# Patient Record
Sex: Female | Born: 1945
Health system: Southern US, Community
[De-identification: ages and names within clinical notes are randomized; demographics above are authoritative.]

## PROBLEM LIST (undated history)

## (undated) DIAGNOSIS — Z923 Personal history of irradiation: Secondary | ICD-10-CM

## (undated) DIAGNOSIS — I4891 Unspecified atrial fibrillation: Secondary | ICD-10-CM

## (undated) HISTORY — PX: TUBAL LIGATION: SHX77

## (undated) HISTORY — PX: ABDOMINAL HYSTERECTOMY: SHX81

---

## 1998-07-12 DIAGNOSIS — Z923 Personal history of irradiation: Secondary | ICD-10-CM

## 1998-07-12 HISTORY — DX: Personal history of irradiation: Z92.3

## 1999-04-16 ENCOUNTER — Other Ambulatory Visit: Admission: RE | Admit: 1999-04-16 | Discharge: 1999-04-16 | Payer: Self-pay | Admitting: Obstetrics and Gynecology

## 1999-04-21 ENCOUNTER — Other Ambulatory Visit: Admission: RE | Admit: 1999-04-21 | Discharge: 1999-04-21 | Payer: Self-pay | Admitting: Obstetrics and Gynecology

## 1999-05-11 ENCOUNTER — Encounter: Payer: Self-pay | Admitting: Surgery

## 1999-05-11 ENCOUNTER — Encounter: Admission: RE | Admit: 1999-05-11 | Discharge: 1999-05-11 | Payer: Self-pay | Admitting: Surgery

## 1999-05-12 ENCOUNTER — Encounter (INDEPENDENT_AMBULATORY_CARE_PROVIDER_SITE_OTHER): Payer: Self-pay | Admitting: Specialist

## 1999-05-12 ENCOUNTER — Ambulatory Visit (HOSPITAL_BASED_OUTPATIENT_CLINIC_OR_DEPARTMENT_OTHER): Admission: RE | Admit: 1999-05-12 | Discharge: 1999-05-12 | Payer: Self-pay | Admitting: Surgery

## 1999-05-26 ENCOUNTER — Ambulatory Visit (HOSPITAL_COMMUNITY): Admission: RE | Admit: 1999-05-26 | Discharge: 1999-05-26 | Payer: Self-pay | Admitting: Surgery

## 1999-05-26 ENCOUNTER — Encounter: Payer: Self-pay | Admitting: Surgery

## 1999-06-03 ENCOUNTER — Encounter: Admission: RE | Admit: 1999-06-03 | Discharge: 1999-09-01 | Payer: Self-pay | Admitting: Radiation Oncology

## 1999-06-30 ENCOUNTER — Encounter: Payer: Self-pay | Admitting: Oncology

## 1999-06-30 ENCOUNTER — Ambulatory Visit (HOSPITAL_COMMUNITY): Admission: RE | Admit: 1999-06-30 | Discharge: 1999-06-30 | Payer: Self-pay | Admitting: Oncology

## 1999-12-21 ENCOUNTER — Encounter: Admission: RE | Admit: 1999-12-21 | Discharge: 2000-03-20 | Payer: Self-pay | Admitting: Radiation Oncology

## 1999-12-24 ENCOUNTER — Encounter: Payer: Self-pay | Admitting: Oncology

## 1999-12-24 ENCOUNTER — Ambulatory Visit (HOSPITAL_COMMUNITY): Admission: RE | Admit: 1999-12-24 | Discharge: 1999-12-24 | Payer: Self-pay | Admitting: Oncology

## 2000-05-10 ENCOUNTER — Other Ambulatory Visit: Admission: RE | Admit: 2000-05-10 | Discharge: 2000-05-10 | Payer: Self-pay | Admitting: Obstetrics and Gynecology

## 2000-07-12 HISTORY — PX: MASTECTOMY: SHX3

## 2000-07-12 HISTORY — PX: BREAST BIOPSY: SHX20

## 2000-08-15 ENCOUNTER — Other Ambulatory Visit: Admission: RE | Admit: 2000-08-15 | Discharge: 2000-08-15 | Payer: Self-pay | Admitting: Surgery

## 2000-08-19 ENCOUNTER — Encounter: Admission: RE | Admit: 2000-08-19 | Discharge: 2000-08-19 | Payer: Self-pay | Admitting: Surgery

## 2000-08-19 ENCOUNTER — Encounter: Payer: Self-pay | Admitting: Surgery

## 2001-02-23 ENCOUNTER — Encounter: Payer: Self-pay | Admitting: Oncology

## 2001-02-23 ENCOUNTER — Ambulatory Visit (HOSPITAL_COMMUNITY): Admission: RE | Admit: 2001-02-23 | Discharge: 2001-02-23 | Payer: Self-pay | Admitting: Oncology

## 2001-05-22 ENCOUNTER — Encounter: Payer: Self-pay | Admitting: Oncology

## 2001-05-22 ENCOUNTER — Encounter: Admission: RE | Admit: 2001-05-22 | Discharge: 2001-05-22 | Payer: Self-pay | Admitting: Oncology

## 2002-02-06 ENCOUNTER — Encounter: Payer: Self-pay | Admitting: Oncology

## 2002-02-06 ENCOUNTER — Ambulatory Visit (HOSPITAL_COMMUNITY): Admission: RE | Admit: 2002-02-06 | Discharge: 2002-02-06 | Payer: Self-pay | Admitting: Oncology

## 2002-05-24 ENCOUNTER — Encounter: Payer: Self-pay | Admitting: Oncology

## 2002-05-24 ENCOUNTER — Encounter: Admission: RE | Admit: 2002-05-24 | Discharge: 2002-05-24 | Payer: Self-pay | Admitting: Oncology

## 2002-08-12 IMAGING — CT CT CHEST W/ CM
1 of 2 series · 14 of 32 positions shown, 18 images · IV contrast ([ID] [id number])
Comparison: none

FINDINGS
CLINICAL DATA: HISTORY OF BREAST CANCER IN April 1999.
CT CHEST WITH INTRAVENOUS CONTRAST:
IMAGES ARE OBTAINED THROUGH THE CHEST FOLLOWING THE ADMINISTRATION OF 100 CC ON OMNIPAQUE 300%
INTRAVENOUSLY.  COMPARISON IS MADE TO PRIOR EXAM DATED 12/24/99.
NO ENLARGED HILAR OR MEDIASTINAL LYMPH NODES ARE IDENTIFIED.  A PREVIOUSLY NOTED PRECARINAL LYMPH
NODE ON PRIOR STUDY IS SMALLER THAN ON PRIOR EXAM.  NO PULMONARY MASS IS IDENTIFIED.  THERE ARE
TINY BLEBS AT THE LUNG APICES.  NO PLEURAL EFFUSION IS NOTED.  NO FOCAL CONSOLIDATION IS NOTED.
VISUALIZED PORTION OF THE LIVER IS NORMAL.
IMPRESSION
NO ACUTE ABNORMALITY OF THE CHEST.

[Series 2: — · axial · 0.68mm/px · z∈[-295,-8]mm · 14 of 49 slices shown, 18 images]
[im 4/49  mediastinal]
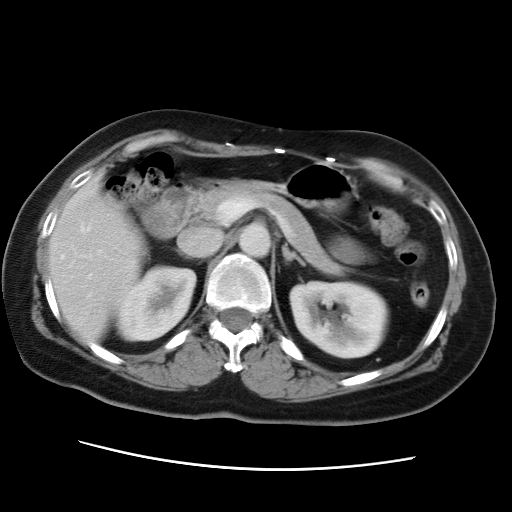
[im 4/49  lung]
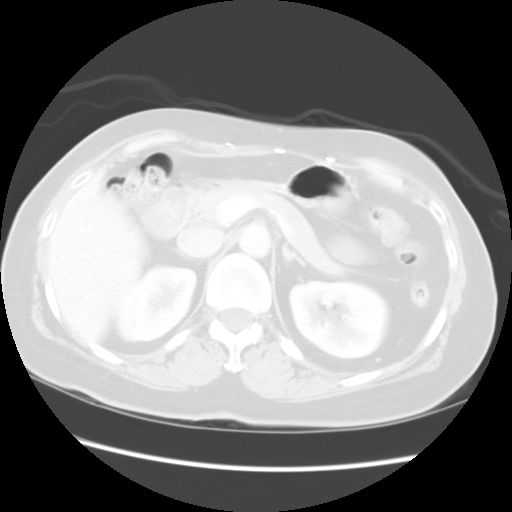
[im 8/49  lung]
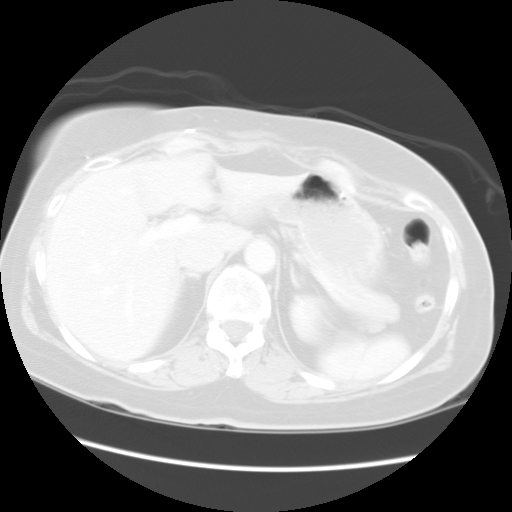
[im 12/49  lung]
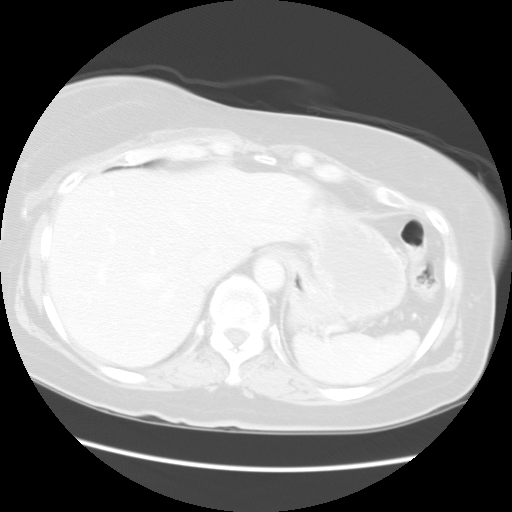
[im 15/49  lung]
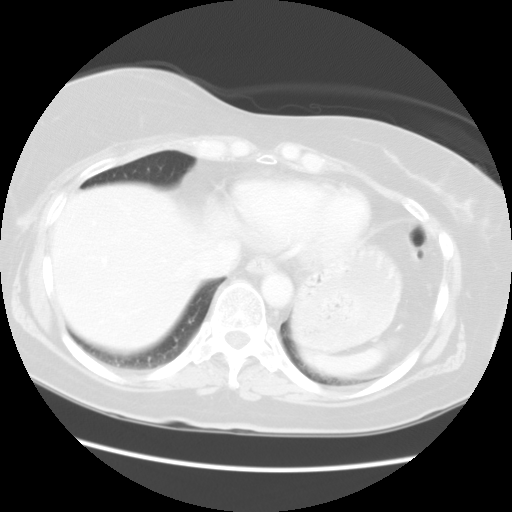
[im 19/49  mediastinal]
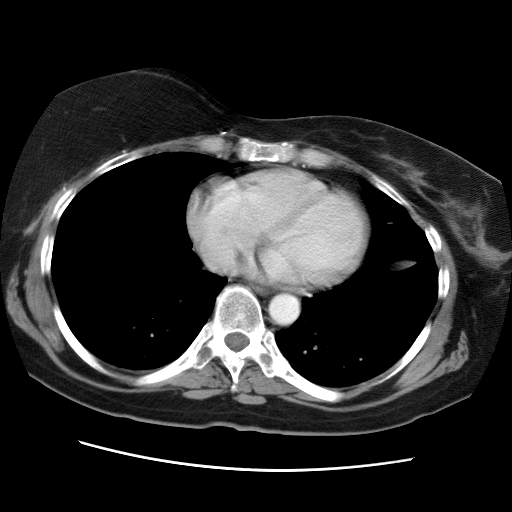
[im 19/49  lung]
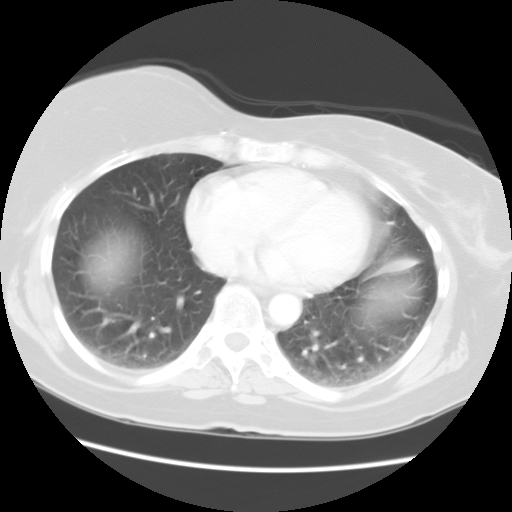
[im 23/49  lung]
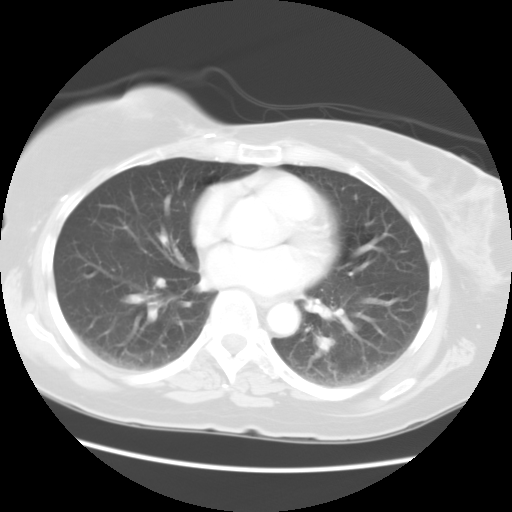
[im 24/49  lung]
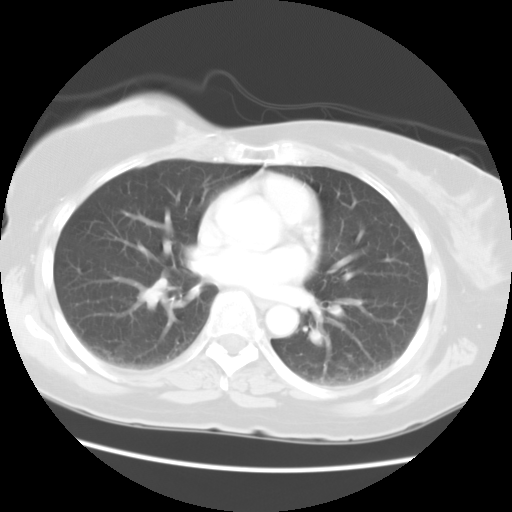
[im 25/49  lung]
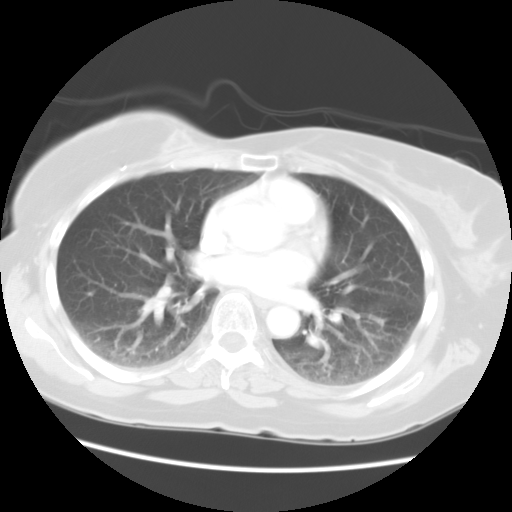
[im 26/49  mediastinal]
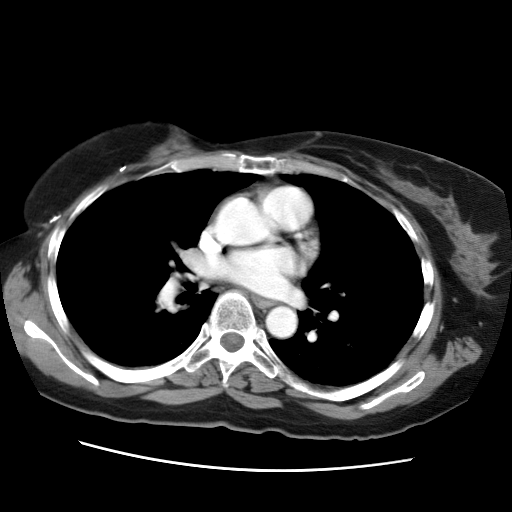
[im 26/49  lung]
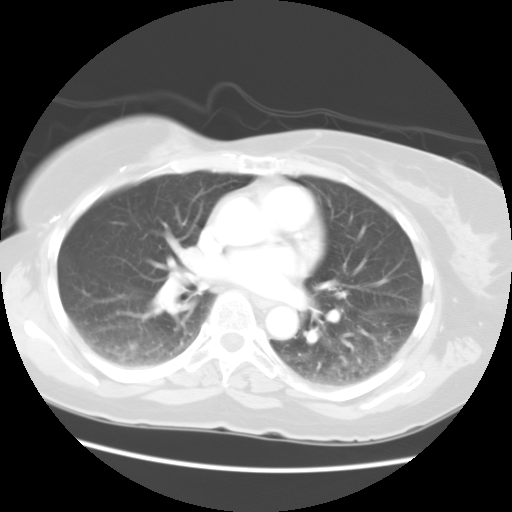
[im 30/49  lung]
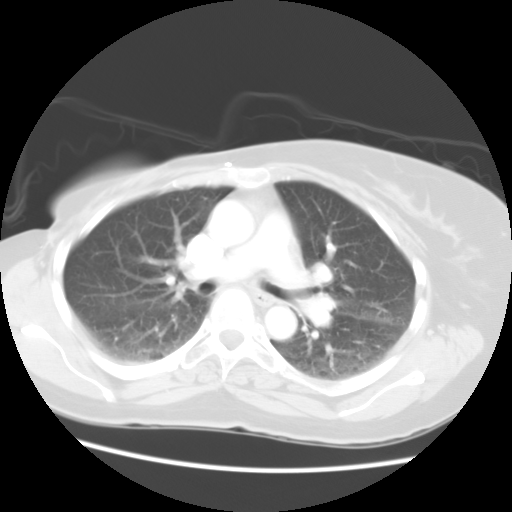
[im 34/49  lung]
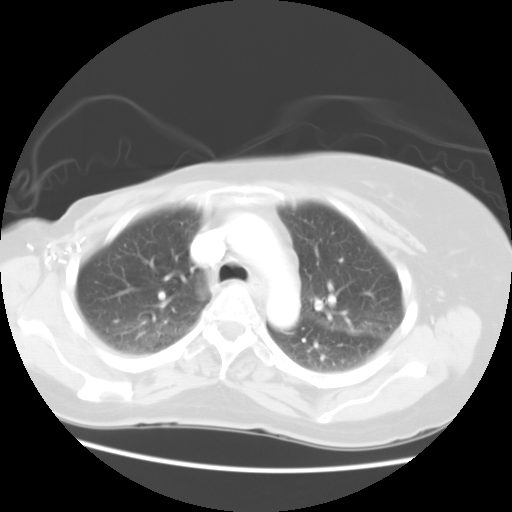
[im 37/49  lung]
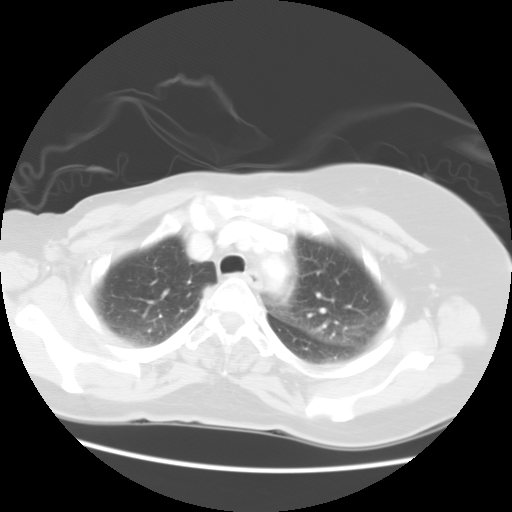
[im 41/49  mediastinal]
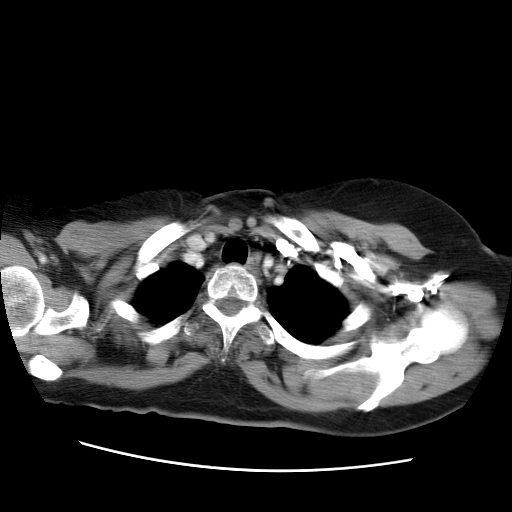
[im 41/49  lung]
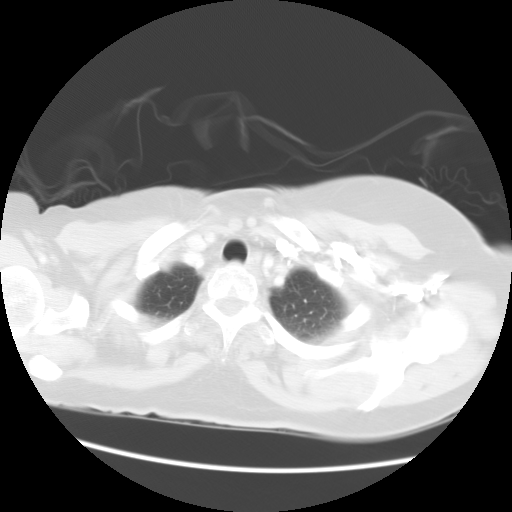
[im 45/49  lung]
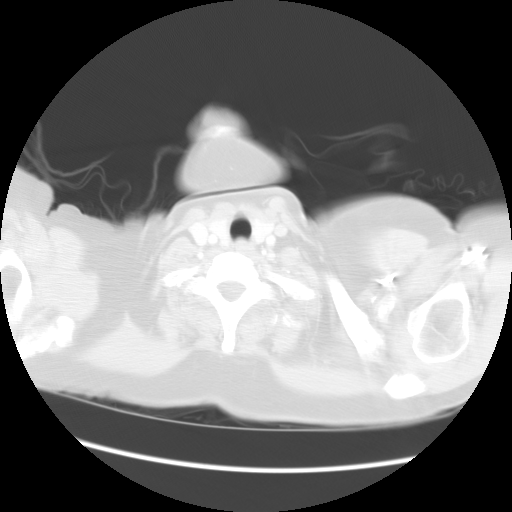

[14 of 32 positions shown; findings below may reference images not displayed]

## 2003-05-27 ENCOUNTER — Encounter: Admission: RE | Admit: 2003-05-27 | Discharge: 2003-05-27 | Payer: Self-pay | Admitting: Oncology

## 2003-10-15 ENCOUNTER — Encounter: Admission: RE | Admit: 2003-10-15 | Discharge: 2003-10-15 | Payer: Self-pay | Admitting: Oncology

## 2004-06-18 ENCOUNTER — Encounter: Admission: RE | Admit: 2004-06-18 | Discharge: 2004-06-18 | Payer: Self-pay | Admitting: Oncology

## 2005-07-13 ENCOUNTER — Encounter: Admission: RE | Admit: 2005-07-13 | Discharge: 2005-07-13 | Payer: Self-pay | Admitting: Obstetrics and Gynecology

## 2006-09-13 ENCOUNTER — Encounter: Admission: RE | Admit: 2006-09-13 | Discharge: 2006-09-13 | Payer: Self-pay | Admitting: Obstetrics and Gynecology

## 2008-08-27 ENCOUNTER — Encounter: Admission: RE | Admit: 2008-08-27 | Discharge: 2008-08-27 | Payer: Self-pay | Admitting: Internal Medicine

## 2009-09-02 ENCOUNTER — Encounter: Admission: RE | Admit: 2009-09-02 | Discharge: 2009-09-02 | Payer: Self-pay | Admitting: Internal Medicine

## 2010-08-01 ENCOUNTER — Encounter: Payer: Self-pay | Admitting: Oncology

## 2012-08-10 DIAGNOSIS — N814 Uterovaginal prolapse, unspecified: Secondary | ICD-10-CM | POA: Insufficient documentation

## 2012-11-17 ENCOUNTER — Other Ambulatory Visit: Payer: Self-pay | Admitting: Internal Medicine

## 2012-11-17 DIAGNOSIS — Z1231 Encounter for screening mammogram for malignant neoplasm of breast: Secondary | ICD-10-CM

## 2012-11-17 DIAGNOSIS — Z9011 Acquired absence of right breast and nipple: Secondary | ICD-10-CM

## 2012-12-20 ENCOUNTER — Ambulatory Visit
Admission: RE | Admit: 2012-12-20 | Discharge: 2012-12-20 | Disposition: A | Discharge status: Home / Self Care | Payer: Medicare Other | Source: Ambulatory Visit | Attending: Internal Medicine | Admitting: Internal Medicine

## 2012-12-20 DIAGNOSIS — Z1231 Encounter for screening mammogram for malignant neoplasm of breast: Secondary | ICD-10-CM

## 2012-12-20 DIAGNOSIS — Z9011 Acquired absence of right breast and nipple: Secondary | ICD-10-CM

## 2013-12-24 ENCOUNTER — Other Ambulatory Visit: Payer: Self-pay

## 2013-12-24 DIAGNOSIS — Z9011 Acquired absence of right breast and nipple: Secondary | ICD-10-CM

## 2013-12-24 DIAGNOSIS — Z1231 Encounter for screening mammogram for malignant neoplasm of breast: Secondary | ICD-10-CM

## 2014-01-03 ENCOUNTER — Encounter (INDEPENDENT_AMBULATORY_CARE_PROVIDER_SITE_OTHER): Payer: Self-pay

## 2014-01-03 ENCOUNTER — Ambulatory Visit
Admission: RE | Admit: 2014-01-03 | Discharge: 2014-01-03 | Disposition: A | Discharge status: Home / Self Care | Payer: Medicare Other | Source: Ambulatory Visit

## 2014-01-03 DIAGNOSIS — Z1231 Encounter for screening mammogram for malignant neoplasm of breast: Secondary | ICD-10-CM

## 2014-01-03 DIAGNOSIS — Z9011 Acquired absence of right breast and nipple: Secondary | ICD-10-CM

## 2014-01-10 DIAGNOSIS — N83209 Unspecified ovarian cyst, unspecified side: Secondary | ICD-10-CM | POA: Insufficient documentation

## 2014-05-07 DIAGNOSIS — N3946 Mixed incontinence: Secondary | ICD-10-CM | POA: Insufficient documentation

## 2014-05-07 DIAGNOSIS — C50919 Malignant neoplasm of unspecified site of unspecified female breast: Secondary | ICD-10-CM | POA: Insufficient documentation

## 2015-04-25 ENCOUNTER — Other Ambulatory Visit: Payer: Self-pay

## 2015-04-25 DIAGNOSIS — Z1231 Encounter for screening mammogram for malignant neoplasm of breast: Secondary | ICD-10-CM

## 2015-05-27 ENCOUNTER — Ambulatory Visit
Admission: RE | Admit: 2015-05-27 | Discharge: 2015-05-27 | Disposition: A | Discharge status: Home / Self Care | Payer: Medicare Other | Source: Ambulatory Visit

## 2015-05-27 DIAGNOSIS — Z1231 Encounter for screening mammogram for malignant neoplasm of breast: Secondary | ICD-10-CM

## 2016-07-16 DIAGNOSIS — Z01419 Encounter for gynecological examination (general) (routine) without abnormal findings: Secondary | ICD-10-CM | POA: Diagnosis not present

## 2016-08-10 ENCOUNTER — Other Ambulatory Visit: Payer: Self-pay | Admitting: Internal Medicine

## 2016-08-10 DIAGNOSIS — Z1231 Encounter for screening mammogram for malignant neoplasm of breast: Secondary | ICD-10-CM

## 2016-08-23 ENCOUNTER — Ambulatory Visit
Admission: RE | Admit: 2016-08-23 | Discharge: 2016-08-23 | Disposition: A | Discharge status: Home / Self Care | Payer: Medicare Other | Source: Ambulatory Visit | Attending: Internal Medicine | Admitting: Internal Medicine

## 2016-08-23 DIAGNOSIS — Z1231 Encounter for screening mammogram for malignant neoplasm of breast: Secondary | ICD-10-CM | POA: Diagnosis not present

## 2016-08-23 HISTORY — DX: Personal history of irradiation: Z92.3

## 2016-09-27 DIAGNOSIS — J209 Acute bronchitis, unspecified: Secondary | ICD-10-CM | POA: Diagnosis not present

## 2016-09-27 DIAGNOSIS — J309 Allergic rhinitis, unspecified: Secondary | ICD-10-CM | POA: Diagnosis not present

## 2016-09-27 DIAGNOSIS — J0100 Acute maxillary sinusitis, unspecified: Secondary | ICD-10-CM | POA: Diagnosis not present

## 2016-10-22 DIAGNOSIS — R5383 Other fatigue: Secondary | ICD-10-CM | POA: Diagnosis not present

## 2016-10-22 DIAGNOSIS — M5126 Other intervertebral disc displacement, lumbar region: Secondary | ICD-10-CM | POA: Insufficient documentation

## 2016-10-22 DIAGNOSIS — R0789 Other chest pain: Secondary | ICD-10-CM | POA: Diagnosis not present

## 2016-10-22 DIAGNOSIS — Z853 Personal history of malignant neoplasm of breast: Secondary | ICD-10-CM | POA: Diagnosis not present

## 2016-10-22 DIAGNOSIS — K219 Gastro-esophageal reflux disease without esophagitis: Secondary | ICD-10-CM | POA: Diagnosis not present

## 2016-10-22 DIAGNOSIS — D649 Anemia, unspecified: Secondary | ICD-10-CM | POA: Insufficient documentation

## 2016-10-22 DIAGNOSIS — R0602 Shortness of breath: Secondary | ICD-10-CM | POA: Diagnosis not present

## 2016-10-22 DIAGNOSIS — R079 Chest pain, unspecified: Secondary | ICD-10-CM | POA: Diagnosis not present

## 2016-10-22 DIAGNOSIS — R5381 Other malaise: Secondary | ICD-10-CM | POA: Diagnosis not present

## 2016-10-22 DIAGNOSIS — E782 Mixed hyperlipidemia: Secondary | ICD-10-CM | POA: Diagnosis not present

## 2016-10-22 DIAGNOSIS — Z79899 Other long term (current) drug therapy: Secondary | ICD-10-CM | POA: Diagnosis not present

## 2016-10-24 DIAGNOSIS — R0789 Other chest pain: Secondary | ICD-10-CM | POA: Insufficient documentation

## 2016-10-24 DIAGNOSIS — K219 Gastro-esophageal reflux disease without esophagitis: Secondary | ICD-10-CM | POA: Insufficient documentation

## 2016-10-25 DIAGNOSIS — D519 Vitamin B12 deficiency anemia, unspecified: Secondary | ICD-10-CM | POA: Diagnosis not present

## 2016-10-25 DIAGNOSIS — R5381 Other malaise: Secondary | ICD-10-CM | POA: Diagnosis not present

## 2016-10-25 DIAGNOSIS — E782 Mixed hyperlipidemia: Secondary | ICD-10-CM | POA: Diagnosis not present

## 2016-10-25 DIAGNOSIS — R5383 Other fatigue: Secondary | ICD-10-CM | POA: Diagnosis not present

## 2017-07-21 ENCOUNTER — Other Ambulatory Visit: Payer: Self-pay | Admitting: Internal Medicine

## 2017-07-21 DIAGNOSIS — Z1231 Encounter for screening mammogram for malignant neoplasm of breast: Secondary | ICD-10-CM

## 2017-08-24 ENCOUNTER — Ambulatory Visit
Admission: RE | Admit: 2017-08-24 | Discharge: 2017-08-24 | Disposition: A | Discharge status: Home / Self Care | Payer: Medicare Other | Source: Ambulatory Visit | Attending: Internal Medicine | Admitting: Internal Medicine

## 2017-08-24 DIAGNOSIS — Z1231 Encounter for screening mammogram for malignant neoplasm of breast: Secondary | ICD-10-CM

## 2017-09-22 DIAGNOSIS — H2513 Age-related nuclear cataract, bilateral: Secondary | ICD-10-CM | POA: Diagnosis not present

## 2018-04-25 DIAGNOSIS — R5383 Other fatigue: Secondary | ICD-10-CM | POA: Diagnosis not present

## 2018-04-25 DIAGNOSIS — M5126 Other intervertebral disc displacement, lumbar region: Secondary | ICD-10-CM | POA: Diagnosis not present

## 2018-04-25 DIAGNOSIS — R0789 Other chest pain: Secondary | ICD-10-CM | POA: Diagnosis not present

## 2018-04-25 DIAGNOSIS — R5381 Other malaise: Secondary | ICD-10-CM | POA: Diagnosis not present

## 2018-04-25 DIAGNOSIS — Z853 Personal history of malignant neoplasm of breast: Secondary | ICD-10-CM | POA: Diagnosis not present

## 2018-04-25 DIAGNOSIS — D649 Anemia, unspecified: Secondary | ICD-10-CM | POA: Diagnosis not present

## 2018-04-25 DIAGNOSIS — E782 Mixed hyperlipidemia: Secondary | ICD-10-CM | POA: Diagnosis not present

## 2018-04-25 DIAGNOSIS — K219 Gastro-esophageal reflux disease without esophagitis: Secondary | ICD-10-CM | POA: Diagnosis not present

## 2018-04-25 DIAGNOSIS — Z90710 Acquired absence of both cervix and uterus: Secondary | ICD-10-CM | POA: Diagnosis not present

## 2018-04-26 ENCOUNTER — Other Ambulatory Visit: Payer: Self-pay

## 2018-04-26 DIAGNOSIS — D649 Anemia, unspecified: Secondary | ICD-10-CM | POA: Diagnosis not present

## 2018-04-26 DIAGNOSIS — R5383 Other fatigue: Secondary | ICD-10-CM | POA: Diagnosis not present

## 2018-04-26 DIAGNOSIS — R5381 Other malaise: Secondary | ICD-10-CM | POA: Diagnosis not present

## 2018-04-26 DIAGNOSIS — D219 Benign neoplasm of connective and other soft tissue, unspecified: Secondary | ICD-10-CM | POA: Insufficient documentation

## 2018-04-26 DIAGNOSIS — E782 Mixed hyperlipidemia: Secondary | ICD-10-CM | POA: Diagnosis not present

## 2018-05-11 DIAGNOSIS — K5900 Constipation, unspecified: Secondary | ICD-10-CM | POA: Diagnosis not present

## 2018-05-11 DIAGNOSIS — R1032 Left lower quadrant pain: Secondary | ICD-10-CM | POA: Diagnosis not present

## 2018-05-11 DIAGNOSIS — K219 Gastro-esophageal reflux disease without esophagitis: Secondary | ICD-10-CM | POA: Diagnosis not present

## 2018-05-17 ENCOUNTER — Ambulatory Visit (INDEPENDENT_AMBULATORY_CARE_PROVIDER_SITE_OTHER): Payer: Medicare Other | Admitting: Cardiology

## 2018-05-17 ENCOUNTER — Encounter: Payer: Self-pay | Admitting: Cardiology

## 2018-05-17 VITALS — BP 126/68 | HR 71 | Ht 68.0 in | Wt 171.0 lb

## 2018-05-17 DIAGNOSIS — R0789 Other chest pain: Secondary | ICD-10-CM | POA: Diagnosis not present

## 2018-05-17 DIAGNOSIS — E782 Mixed hyperlipidemia: Secondary | ICD-10-CM

## 2018-05-17 NOTE — Progress Notes (Signed)
Cardiology Office Note:    Date:  05/17/2018   ID:  Jennifer Farrell, DOB [birth year], MRN [id number]  PCP:  Jennifer Farrell., MD  Cardiologist:  Jennifer Lindau, MD   Referring MD: Jennifer Farrell., MD    ASSESSMENT:    1. Chest discomfort   2. Mixed hyperlipidemia    PLAN:    In order of problems listed above:  1. Primary prevention stressed with the patient.  Importance of compliance with diet and medication stressed and she vocalized understanding.  Her blood pressure is stable.  Diet was discussed with dyslipidemia and these issues are followed by her primary care physician. 2. She has multiple risk factors for coronary artery disease.  She leads a sedentary lifestyle.  Her chest pain is atypical but in view of the risk factors she will undergo Lexiscan sestamibi.  She knows to go to the nearest emergency room if she has any significant problems. 3. Patient will be seen in follow-up appointment in 6 months or earlier if the patient has any concerns    Medication Adjustments/Labs and Tests Ordered: Current medicines are reviewed at length with the patient today.  Concerns regarding medicines are outlined above.  No orders of the defined types were placed in this encounter.  No orders of the defined types were placed in this encounter.    History of Present Illness:    Jennifer Farrell is a 72 y.o. female who is being seen today for the evaluation of chest discomfort at the request of Jennifer Farrell., MD.  Patient is a pleasant 72 year old female.  She is overall in good health.  She leads a sedentary lifestyle because of orthopedic issues involving her back.  She is referred here for chest discomfort.  She tells me that this was a sharp pain-like sensation.  She has not had this sensation for the past 3 or 4 weeks now.  She was not doing anything when it happened.  Exertion does not bring about the symptoms.  No orthopnea or PND.  At the time of my evaluation, the patient is alert  awake oriented and in no distress.  No radiation of the symptoms to the neck or to the arms.  Past Medical History:  Diagnosis Date  . Personal history of radiation therapy 2000    Past Surgical History:  Procedure Laterality Date  . ABDOMINAL HYSTERECTOMY    . BREAST BIOPSY Right 2002  . MASTECTOMY Right 2002  . TUBAL LIGATION      Current Medications: Current Meds  Medication Sig  . aspirin EC 81 MG tablet Take 1 tablet by mouth as needed.   . calcium-vitamin D (OSCAL WITH D) 500-200 MG-UNIT TABS tablet Take 1 tablet by mouth 2 (two) times daily.  Marland Kitchen ibuprofen (ADVIL,MOTRIN) 200 MG tablet Take 1 tablet by mouth as needed.  . Multiple Vitamin (MULTI-VITAMINS) TABS Take 1 tablet by mouth daily.  . Omega-3 Fatty Acids (RA FISH OIL) 1000 MG CAPS Take 1 tablet by mouth daily.     Allergies:   Adhesive [tape]; Morphine; Amoxicillin; and Morphine sulfate   Social History   Socioeconomic History  . Marital status: Married    Spouse name: Not on file  . Number of children: Not on file  . Years of education: Not on file  . Highest education level: Not on file  Occupational History  . Not on file  Social Needs  . Financial resource strain: Not on file  . Food insecurity:  Worry: Not on file    Inability: Not on file  . Transportation needs:    Medical: Not on file    Non-medical: Not on file  Tobacco Use  . Smoking status: Former Research scientist (life sciences)  . Smokeless tobacco: Never Used  Substance and Sexual Activity  . Alcohol use: Not on file  . Drug use: Not on file  . Sexual activity: Not on file  Lifestyle  . Physical activity:    Days per week: Not on file    Minutes per session: Not on file  . Stress: Not on file  Relationships  . Social connections:    Talks on phone: Not on file    Gets together: Not on file    Attends religious service: Not on file    Active member of club or organization: Not on file    Attends meetings of clubs or organizations: Not on file     Relationship status: Not on file  Other Topics Concern  . Not on file  Social History Narrative  . Not on file     Family History: The patient's family history includes Breast cancer (age of onset: 51) in her sister; Cancer in her brother; Cirrhosis in her father; Diabetes in her brother, mother, and sister; Heart disease in her brother; Leukemia in her sister; Stroke in her mother.  ROS:   Please see the history of present illness.    All other systems reviewed and are negative.  EKGs/Labs/Other Studies Reviewed:    The following studies were reviewed today: EKG reveals sinus rhythm and nonspecific ST-T changes.   Recent Labs: No results found for requested labs within last 8760 hours.  Recent Lipid Panel No results found for: CHOL, TRIG, HDL, CHOLHDL, VLDL, LDLCALC, LDLDIRECT  Physical Exam:    VS:  BP 126/68 (BP Location: Right Arm, Patient Position: Sitting, Cuff Size: Normal)   Pulse 71   Ht 5\' 8"  (1.727 m)   Wt 171 lb (77.6 kg)   SpO2 98%   BMI 26.00 kg/m     Wt Readings from Last 3 Encounters:  05/17/18 171 lb (77.6 kg)     GEN: Patient is in no acute distress HEENT: Normal NECK: No JVD; No carotid bruits LYMPHATICS: No lymphadenopathy CARDIAC: S1 S2 regular, 2/6 systolic murmur at the apex. RESPIRATORY:  Clear to auscultation without rales, wheezing or rhonchi  ABDOMEN: Soft, non-tender, non-distended MUSCULOSKELETAL:  No edema; No deformity  SKIN: Warm and dry NEUROLOGIC:  Alert and oriented x 3 PSYCHIATRIC:  Normal affect    Signed, Jennifer Lindau, MD  05/17/2018 10:37 AM    [place]

## 2018-05-17 NOTE — Patient Instructions (Signed)
Medication Instructions:  Your physician recommends that you continue on your current medications as directed. Please refer to the Current Medication list given to you today.  If you need a refill on your cardiac medications before your next appointment, please call your pharmacy.   Lab work: None  If you have labs (blood work) drawn today and your tests are completely normal, you will receive your results only by: Marland Kitchen MyChart Message (if you have MyChart) OR . A paper copy in the mail If you have any lab test that is abnormal or we need to change your treatment, we will call you to review the results.  Testing/Procedures: Your physician has requested that you have a lexiscan myoview. For further information please visit [email or web address]. Please follow instruction sheet, as given.  Follow-Up: At [facility], you and your health needs are our priority.  As part of our continuing mission to provide you with exceptional heart care, we have created designated Provider Care Teams.  These Care Teams include your primary Cardiologist (physician) and Advanced Practice Providers (APPs -  Physician Assistants and Nurse Practitioners) who all work together to provide you with the care you need, when you need it.  You will need a follow up appointment in 6 months.  Please call our office 2 months in advance to schedule this appointment.  You may see another member of our [employer] Provider Team in [place]: Jenne Campus, MD . Shirlee More, MD  Any Other Special Instructions Will Be Listed Below (If Applicable).

## 2018-06-15 DIAGNOSIS — R05 Cough: Secondary | ICD-10-CM | POA: Diagnosis not present

## 2018-06-15 DIAGNOSIS — J069 Acute upper respiratory infection, unspecified: Secondary | ICD-10-CM | POA: Diagnosis not present

## 2019-01-07 DIAGNOSIS — R0602 Shortness of breath: Secondary | ICD-10-CM | POA: Diagnosis not present

## 2019-01-07 DIAGNOSIS — Z20828 Contact with and (suspected) exposure to other viral communicable diseases: Secondary | ICD-10-CM | POA: Diagnosis not present

## 2019-03-05 ENCOUNTER — Other Ambulatory Visit: Payer: Self-pay | Admitting: Internal Medicine

## 2019-03-05 DIAGNOSIS — Z1231 Encounter for screening mammogram for malignant neoplasm of breast: Secondary | ICD-10-CM

## 2019-03-23 ENCOUNTER — Other Ambulatory Visit: Payer: Self-pay

## 2019-03-23 ENCOUNTER — Ambulatory Visit
Admission: RE | Admit: 2019-03-23 | Discharge: 2019-03-23 | Disposition: A | Discharge status: Home / Self Care | Payer: Medicare Other | Source: Ambulatory Visit | Attending: Internal Medicine | Admitting: Internal Medicine

## 2019-03-23 DIAGNOSIS — Z1231 Encounter for screening mammogram for malignant neoplasm of breast: Secondary | ICD-10-CM

## 2019-12-12 DIAGNOSIS — D485 Neoplasm of uncertain behavior of skin: Secondary | ICD-10-CM | POA: Diagnosis not present

## 2019-12-12 DIAGNOSIS — L820 Inflamed seborrheic keratosis: Secondary | ICD-10-CM | POA: Diagnosis not present

## 2020-03-14 DIAGNOSIS — R102 Pelvic and perineal pain: Secondary | ICD-10-CM | POA: Diagnosis not present

## 2020-04-08 ENCOUNTER — Other Ambulatory Visit: Payer: Self-pay | Admitting: Internal Medicine

## 2020-04-08 DIAGNOSIS — Z0000 Encounter for general adult medical examination without abnormal findings: Secondary | ICD-10-CM

## 2020-04-15 DIAGNOSIS — K5901 Slow transit constipation: Secondary | ICD-10-CM | POA: Diagnosis not present

## 2020-04-15 DIAGNOSIS — M7918 Myalgia, other site: Secondary | ICD-10-CM | POA: Diagnosis not present

## 2020-04-15 DIAGNOSIS — M62838 Other muscle spasm: Secondary | ICD-10-CM | POA: Diagnosis not present

## 2020-04-15 DIAGNOSIS — N398 Other specified disorders of urinary system: Secondary | ICD-10-CM | POA: Diagnosis not present

## 2020-04-28 ENCOUNTER — Other Ambulatory Visit: Payer: Self-pay

## 2020-04-28 ENCOUNTER — Ambulatory Visit
Admission: RE | Admit: 2020-04-28 | Discharge: 2020-04-28 | Disposition: A | Discharge status: Home / Self Care | Payer: Medicare Other | Source: Ambulatory Visit | Attending: Internal Medicine | Admitting: Internal Medicine

## 2020-04-28 DIAGNOSIS — Z1231 Encounter for screening mammogram for malignant neoplasm of breast: Secondary | ICD-10-CM | POA: Diagnosis not present

## 2020-04-28 DIAGNOSIS — Z0000 Encounter for general adult medical examination without abnormal findings: Secondary | ICD-10-CM

## 2020-04-29 ENCOUNTER — Ambulatory Visit: Payer: Medicare Other

## 2021-03-25 ENCOUNTER — Other Ambulatory Visit: Payer: Self-pay | Admitting: Internal Medicine

## 2021-03-25 DIAGNOSIS — Z1231 Encounter for screening mammogram for malignant neoplasm of breast: Secondary | ICD-10-CM

## 2021-04-30 ENCOUNTER — Ambulatory Visit: Payer: Medicare Other

## 2021-05-04 ENCOUNTER — Ambulatory Visit
Admission: RE | Admit: 2021-05-04 | Discharge: 2021-05-04 | Disposition: A | Discharge status: Home / Self Care | Payer: Medicare Other | Source: Ambulatory Visit | Attending: Internal Medicine | Admitting: Internal Medicine

## 2021-05-04 ENCOUNTER — Other Ambulatory Visit: Payer: Self-pay

## 2021-05-04 DIAGNOSIS — Z1231 Encounter for screening mammogram for malignant neoplasm of breast: Secondary | ICD-10-CM

## 2021-11-07 IMAGING — MG DIGITAL SCREENING UNILAT LEFT W/ TOMO W/ CAD
4 series · 4 of 12 positions shown · non-contrast
Comparison: Previous exam(s).

CLINICAL DATA: Screening.

EXAM:
DIGITAL SCREENING UNILATERAL LEFT MAMMOGRAM WITH CAD AND
TOMOSYNTHESIS
TECHNIQUE: Left screening digital craniocaudal and mediolateral oblique
mammograms were obtained. Left screening digital breast
tomosynthesis was performed. The images were evaluated with
computer-aided detection.

[L MLO synth-2D]
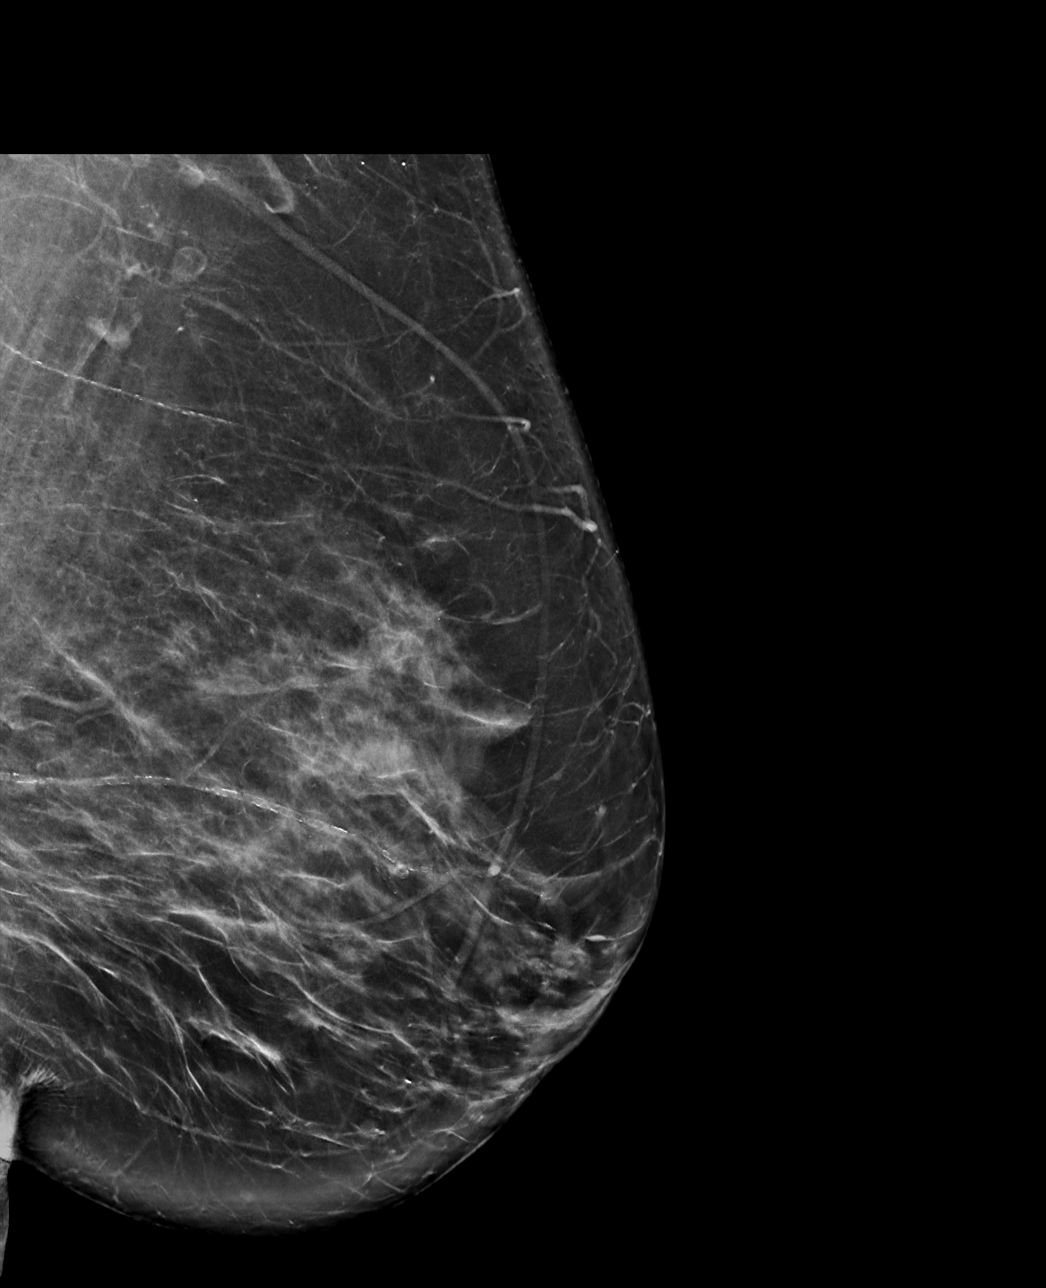

[L CC synth-2D]
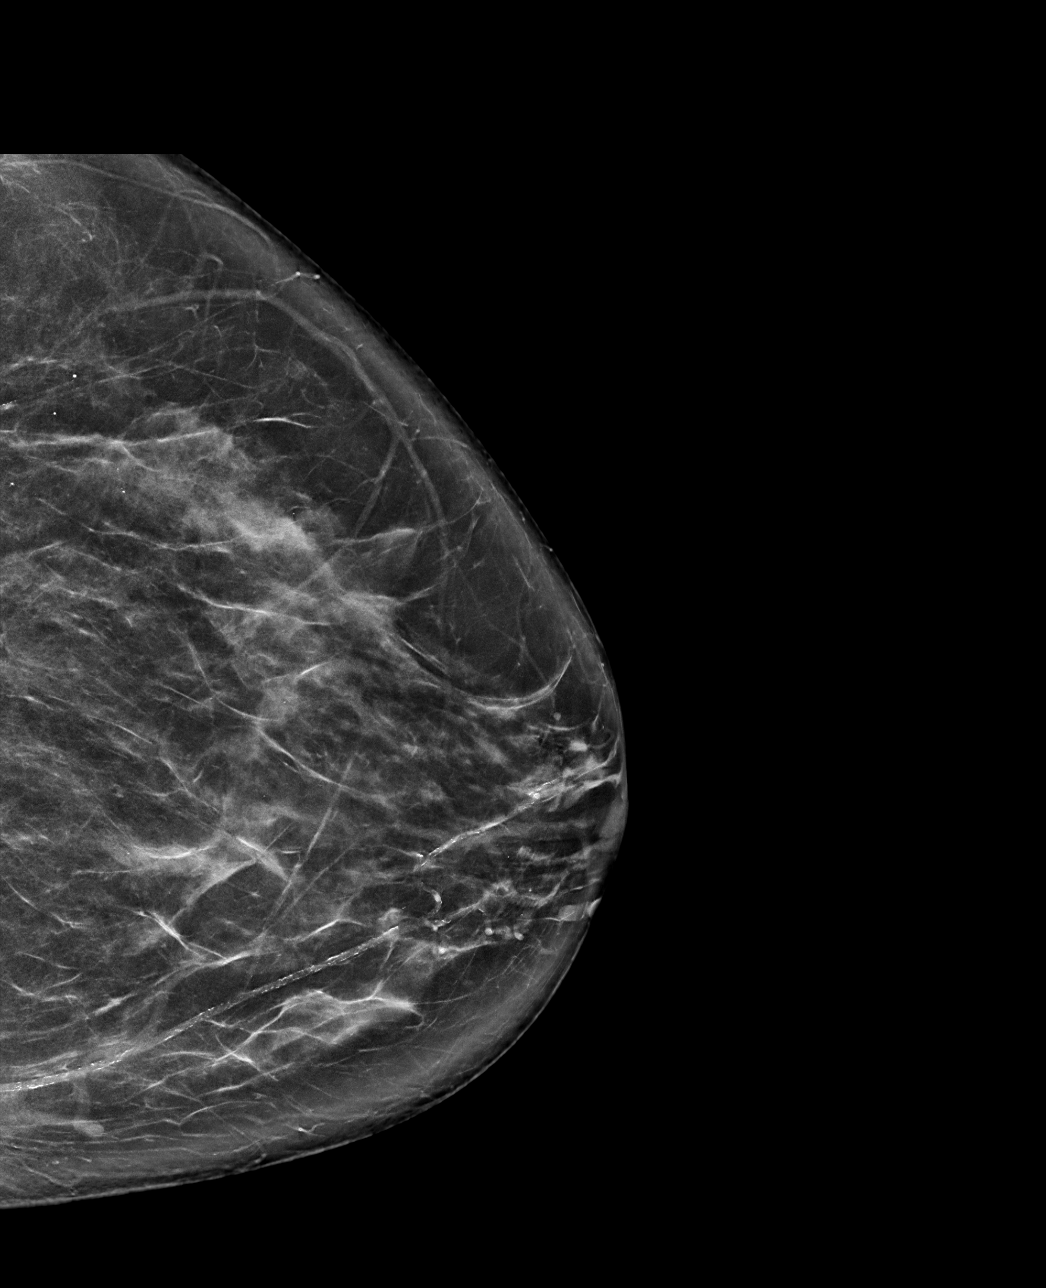

[L MLO tomo · tomo slice 45/89.0]
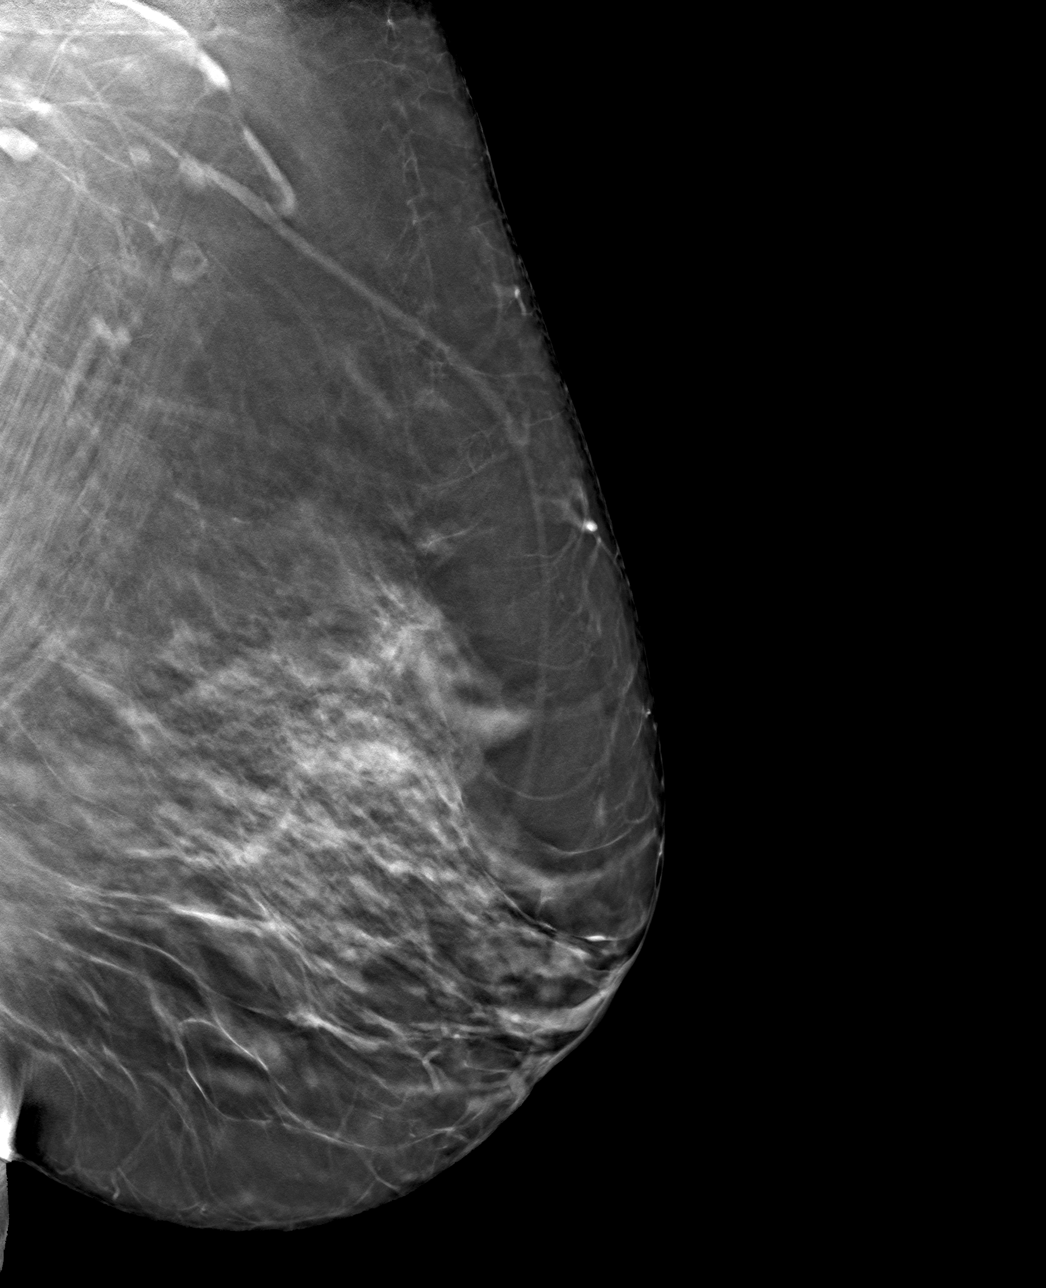

[L CC tomo · tomo slice 43/85.0]
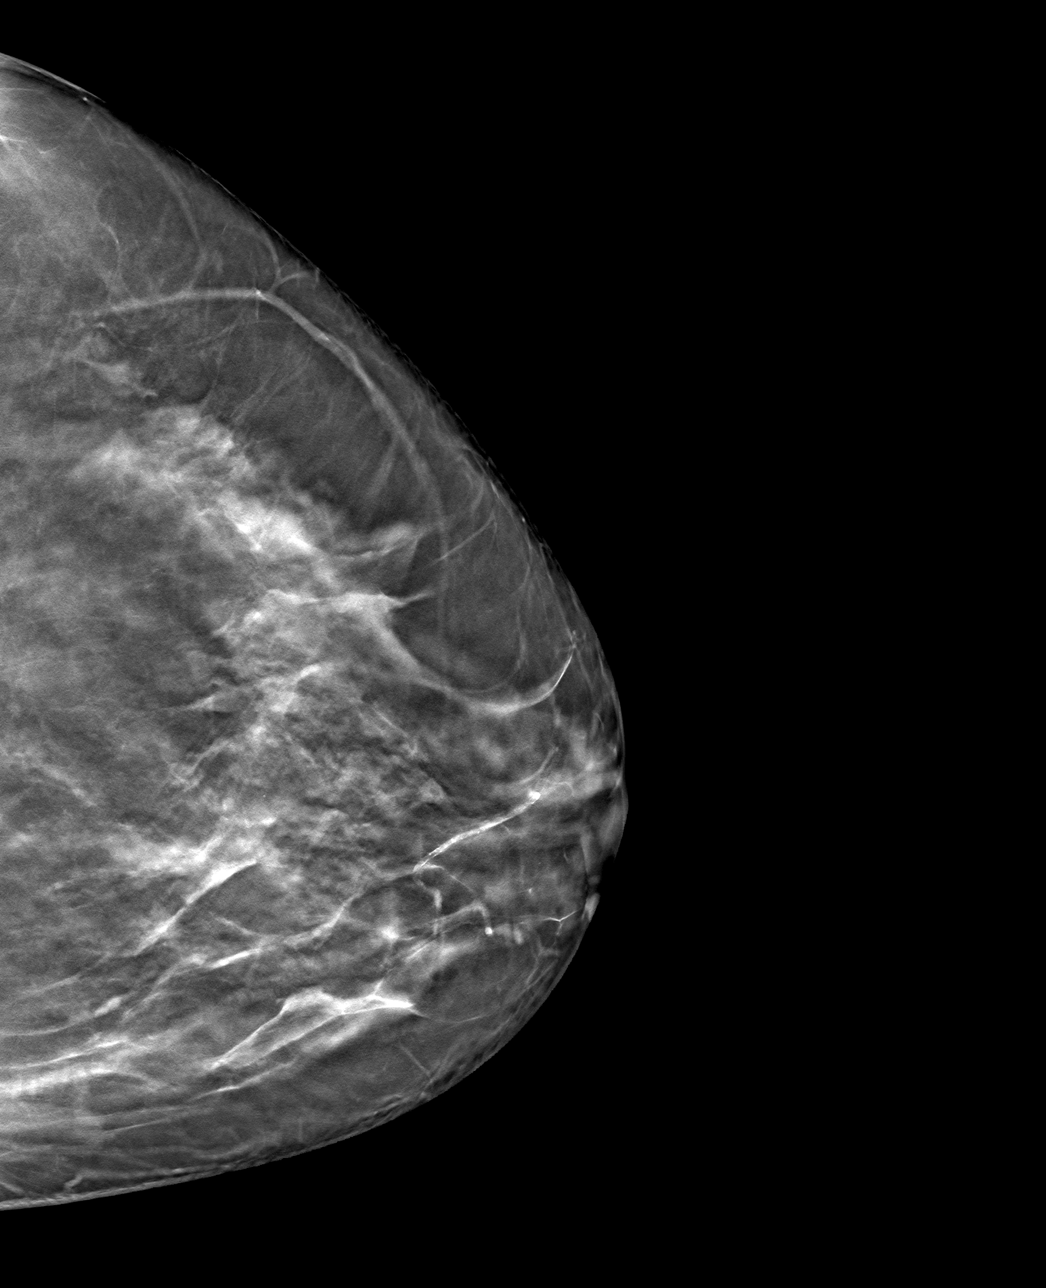

[4 of 12 positions shown; findings below may reference images not displayed]

ACR Breast Density Category c: The breast tissue is heterogeneously
dense, which may obscure small masses.
FINDINGS: The patient has had a right mastectomy. There are no findings
suspicious for malignancy.
IMPRESSION: No mammographic evidence of malignancy. A result letter of this
screening mammogram will be mailed directly to the patient.

RECOMMENDATION:
Screening mammogram in one year.  (Code:HB-T-2WB)

BI-RADS CATEGORY  1: Negative.

## 2022-06-21 ENCOUNTER — Other Ambulatory Visit: Payer: Self-pay | Admitting: Internal Medicine

## 2022-06-21 DIAGNOSIS — Z1231 Encounter for screening mammogram for malignant neoplasm of breast: Secondary | ICD-10-CM

## 2022-08-13 ENCOUNTER — Ambulatory Visit
Admission: RE | Admit: 2022-08-13 | Discharge: 2022-08-13 | Disposition: A | Discharge status: Home / Self Care | Payer: Medicare Other | Source: Ambulatory Visit | Attending: Internal Medicine | Admitting: Internal Medicine

## 2022-08-13 DIAGNOSIS — Z1231 Encounter for screening mammogram for malignant neoplasm of breast: Secondary | ICD-10-CM

## 2023-10-26 ENCOUNTER — Other Ambulatory Visit: Payer: Self-pay | Admitting: Internal Medicine

## 2023-10-26 DIAGNOSIS — Z1231 Encounter for screening mammogram for malignant neoplasm of breast: Secondary | ICD-10-CM

## 2023-11-01 ENCOUNTER — Ambulatory Visit

## 2023-11-07 ENCOUNTER — Ambulatory Visit
Admission: RE | Admit: 2023-11-07 | Discharge: 2023-11-07 | Disposition: A | Discharge status: Home / Self Care | Source: Ambulatory Visit | Attending: Internal Medicine | Admitting: Internal Medicine

## 2023-11-07 DIAGNOSIS — Z1231 Encounter for screening mammogram for malignant neoplasm of breast: Secondary | ICD-10-CM

## 2023-11-10 ENCOUNTER — Other Ambulatory Visit: Payer: Self-pay | Admitting: Internal Medicine

## 2023-11-10 DIAGNOSIS — R928 Other abnormal and inconclusive findings on diagnostic imaging of breast: Secondary | ICD-10-CM

## 2023-11-14 ENCOUNTER — Ambulatory Visit
Admission: RE | Admit: 2023-11-14 | Discharge: 2023-11-14 | Disposition: A | Discharge status: Home / Self Care | Source: Ambulatory Visit | Attending: Internal Medicine | Admitting: Internal Medicine

## 2023-11-14 ENCOUNTER — Ambulatory Visit

## 2023-11-14 DIAGNOSIS — R928 Other abnormal and inconclusive findings on diagnostic imaging of breast: Secondary | ICD-10-CM

## 2023-11-28 ENCOUNTER — Other Ambulatory Visit

## 2023-11-28 ENCOUNTER — Encounter

## 2024-07-28 ENCOUNTER — Ambulatory Visit (HOSPITAL_BASED_OUTPATIENT_CLINIC_OR_DEPARTMENT_OTHER): Admission: EM | Admit: 2024-07-28 | Discharge: 2024-07-28 | Disposition: A | Discharge status: Home / Self Care

## 2024-07-28 ENCOUNTER — Encounter (HOSPITAL_BASED_OUTPATIENT_CLINIC_OR_DEPARTMENT_OTHER): Payer: Self-pay

## 2024-07-28 DIAGNOSIS — F411 Generalized anxiety disorder: Secondary | ICD-10-CM

## 2024-07-28 DIAGNOSIS — I10 Essential (primary) hypertension: Secondary | ICD-10-CM | POA: Diagnosis not present

## 2024-07-28 HISTORY — DX: Unspecified atrial fibrillation: I48.91

## 2024-07-28 MED ORDER — HYDROXYZINE HCL 25 MG PO TABS: 25.0000 mg | ORAL_TABLET | Freq: Every evening | ORAL | 0 refills | Status: AC

## 2024-07-28 MED ORDER — HYDROXYZINE HCL 25 MG PO TABS: 25.0000 mg | ORAL_TABLET | Freq: Three times a day (TID) | ORAL | 0 refills | Status: DC | PRN

## 2024-07-28 NOTE — ED Triage Notes (Addendum)
 States at 2am this morning, patient was up to use bathroom and thought her blood pressure felt like it was elevated. Took blood pressure and it was 185/103. Took an extra dose of metoprolol and blood pressure came back down. Hx of afib with recent hospitalization. Patient presents today because she doesn't want to go into afib again. States she was recently prescribed xanax to help her relax and rest. Patient states she did not want to take it so asked the pharmacy to delete the script. She now believes she might need something for anxiety or sleep. Patient unsure if she is having anxiety that leads to her elevated blood pressure, or if her blood pressure being elevated is creating anxiety.

## 2024-07-28 NOTE — Discharge Instructions (Signed)
 Take the hydroxyzine  at nighttime for anxiety and to help with sleep.  You can start with half a tab and increase to a full tab if needed.  Follow-up with your doctor for any continued issues.

## 2024-07-29 NOTE — ED Provider Notes (Signed)
 " [facility]    CSN: [id number] Arrival date & time: 07/28/24  0954      History   Chief Complaint Chief Complaint  Patient presents with   Hypertension    HPI Jennifer Farrell is a 79 y.o. female.   Patient is a 79 year old female who presents today for hypertension.  States at 2am this morning, patient was up to use bathroom and thought her blood pressure felt like it was elevated. Took blood pressure and it was 185/103. Took an extra dose of metoprolol and blood pressure came back down. Hx of afib with recent hospitalization. Patient presents today because she doesn't want to go into afib again. States she was recently prescribed xanax to help her relax and rest. Patient states she did not want to take it so asked the pharmacy to delete the script. She now believes she might need something for anxiety or sleep. Patient unsure if she is having anxiety that leads to her elevated blood pressure, or if her blood pressure being elevated is creating anxiety.     Hypertension    Past Medical History:  Diagnosis Date   Atrial fibrillation [facility])    Personal history of radiation therapy 2000    Patient Active Problem List   Diagnosis Date Noted   Chest discomfort 05/17/2018   Leiomyoma 04/26/2018   H/O total hysterectomy 04/25/2018   Gastroesophageal reflux disease without esophagitis 10/24/2016   Other chest pain 10/24/2016   Anemia 10/22/2016   Lumbar herniated disc 10/22/2016   Malaise and fatigue 10/22/2016   Mixed hyperlipidemia 10/22/2016   Breast cancer (HCC) 05/07/2014   Mixed incontinence 05/07/2014   Ovarian cyst 01/10/2014   Uterovaginal prolapse 08/10/2012    Past Surgical History:  Procedure Laterality Date   ABDOMINAL HYSTERECTOMY     BREAST BIOPSY Right 2002   MASTECTOMY Right 2002   TUBAL LIGATION      OB History   No obstetric history on file.      Home Medications    Prior to Admission medications  Medication Sig Start Date End Date  Taking? Authorizing Provider  diltiazem (CARDIZEM) 30 MG tablet Take 30 mg by mouth as needed (for rapid heart rate). 12/16/22  Yes [provider]  flecainide (TAMBOCOR) 100 MG tablet Take 100 mg by mouth 2 (two) times daily. 07/12/24  Yes [provider]  metoprolol succinate (TOPROL-XL) 50 MG 24 hr tablet Take 50 mg by mouth daily. 07/19/24  Yes [provider]  apixaban (ELIQUIS) 5 MG TABS tablet Take 5 mg by mouth 2 (two) times daily.    [provider]  aspirin EC 81 MG tablet Take 1 tablet by mouth as needed.     [provider]  calcium-vitamin D (OSCAL WITH D) 500-200 MG-UNIT TABS tablet Take 1 tablet by mouth 2 (two) times daily.    [provider]  hydrOXYzine  (ATARAX ) 25 MG tablet Take 1 tablet (25 mg total) by mouth at bedtime. 07/28/24   [name], FNP  ibuprofen (ADVIL,MOTRIN) 200 MG tablet Take 1 tablet by mouth as needed.    [provider]  Multiple Vitamin (MULTI-VITAMINS) TABS Take 1 tablet by mouth daily.    [provider]  Omega-3 Fatty Acids (RA FISH OIL) 1000 MG CAPS Take 1 tablet by mouth daily.    [provider]    Family History Family History  Problem Relation Age of Onset   Breast cancer Sister 8   Diabetes Sister  Leukemia Sister    Diabetes Mother    Stroke Mother    Cirrhosis Father    Cancer Brother    Diabetes Brother    Heart disease Brother     Social History Social History[1]   Allergies   Adhesive [tape], Morphine, Amoxicillin, and Morphine sulfate   Review of Systems Review of Systems See HPI  Physical Exam Triage Vital Signs ED Triage Vitals  Encounter Vitals Group     BP 07/28/24 1019 (!) 145/76     Girls Systolic BP Percentile --      Girls Diastolic BP Percentile --      Boys Systolic BP Percentile --      Boys Diastolic BP Percentile --      Pulse Rate 07/28/24 1019 (!) 59     Resp 07/28/24 1019 20     Temp 07/28/24 1019 98.4 F (36.9 C)      Temp Source 07/28/24 1019 Oral     SpO2 07/28/24 1019 96 %     Weight --      Height --      Head Circumference --      Peak Flow --      Pain Score 07/28/24 1020 0     Pain Loc --      Pain Education --      Exclude from Growth Chart --    No data found.  Updated Vital Signs BP (!) 145/76 (BP Location: Left Arm)   Pulse (!) 59   Temp 98.4 F (36.9 C) (Oral)   Resp 20   SpO2 96%   Visual Acuity Right Eye Distance:   Left Eye Distance:   Bilateral Distance:    Right Eye Near:   Left Eye Near:    Bilateral Near:     Physical Exam Vitals and nursing note reviewed.  Constitutional:      General: She is not in acute distress.    Appearance: Normal appearance. She is not ill-appearing, toxic-appearing or diaphoretic.  Cardiovascular:     Rate and Rhythm: Normal rate and regular rhythm.  Pulmonary:     Effort: Pulmonary effort is normal.     Breath sounds: Normal breath sounds.  Neurological:     Mental Status: She is alert.  Psychiatric:        Mood and Affect: Mood normal.      UC Treatments / Results  Labs (all labs ordered are listed, but only abnormal results are displayed) Labs Reviewed - No data to display  EKG   Radiology No results found.  Procedures Procedures (including critical care time)  Medications Ordered in UC Medications - No data to display  Initial Impression / Assessment and Plan / UC Course  I have reviewed the triage vital signs and the nursing notes.  Pertinent labs & imaging results that were available during my care of the patient were reviewed by me and considered in my medical decision making (see chart for details).     Essential hypertension and anxiety-no concerns on exam today.  Vital signs within normal range.  Blood pressure slightly elevated at 145/76.  She is not currently in A-fib.  I am prescribing hydroxyzine  to take at nighttime for anxiety and to help with sleep.  She can start with half tab and increase to  full tab if needed.  Recommend continue to monitor blood pressure and follow-up with cardiology for continued issues Final Clinical Impressions(s) / UC Diagnoses   Final diagnoses:  Essential  hypertension  Anxiety state     Discharge Instructions      Take the hydroxyzine  at nighttime for anxiety and to help with sleep.  You can start with half a tab and increase to a full tab if needed.  Follow-up with your doctor for any continued issues.   ED Prescriptions     Medication Sig Dispense Auth. Provider   hydrOXYzine  (ATARAX ) 25 MG tablet  (Status: Discontinued) Take 1 tablet (25 mg total) by mouth 3 (three) times daily as needed. 30 tablet Macaiah Mangal A, FNP   hydrOXYzine  (ATARAX ) 25 MG tablet Take 1 tablet (25 mg total) by mouth at bedtime. 30 tablet Adah Wilbert LABOR, FNP      PDMP not reviewed this encounter.     [1]  Social History Tobacco Use   Smoking status: Former   Smokeless tobacco: Never     Adah Wilbert LABOR, [place] 07/29/24 [phone]  "
